# Patient Record
Sex: Male | Born: 1985 | Race: Black or African American | Hispanic: No | Marital: Single | Smoking: Current every day smoker
Health system: Southern US, Community
[De-identification: ages and names within clinical notes are randomized; demographics above are authoritative.]

## PROBLEM LIST (undated history)

## (undated) DIAGNOSIS — J45909 Unspecified asthma, uncomplicated: Secondary | ICD-10-CM

## (undated) DIAGNOSIS — T148XXA Other injury of unspecified body region, initial encounter: Secondary | ICD-10-CM

---
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
## 2008-11-29 ENCOUNTER — Emergency Department (HOSPITAL_COMMUNITY): Admission: EM | Admit: 2008-11-29 | Discharge: 2008-11-29 | Payer: Self-pay | Admitting: Emergency Medicine

## 2009-01-22 ENCOUNTER — Emergency Department (HOSPITAL_COMMUNITY): Admission: EM | Admit: 2009-01-22 | Discharge: 2009-01-22 | Payer: Self-pay | Admitting: Emergency Medicine

## 2009-05-23 ENCOUNTER — Emergency Department (HOSPITAL_COMMUNITY): Admission: EM | Admit: 2009-05-23 | Discharge: 2009-05-23 | Payer: Self-pay | Admitting: Emergency Medicine

## 2015-08-12 ENCOUNTER — Emergency Department (HOSPITAL_COMMUNITY): Payer: Self-pay

## 2015-08-12 ENCOUNTER — Observation Stay (HOSPITAL_COMMUNITY)
Admission: EM | Admit: 2015-08-12 | Discharge: 2015-08-13 | Disposition: A | Discharge status: Home / Self Care | Payer: Self-pay | Attending: General Surgery | Admitting: General Surgery

## 2015-08-12 ENCOUNTER — Encounter (HOSPITAL_COMMUNITY): Payer: Self-pay | Admitting: Emergency Medicine

## 2015-08-12 DIAGNOSIS — S21212A Laceration without foreign body of left back wall of thorax without penetration into thoracic cavity, initial encounter: Secondary | ICD-10-CM | POA: Insufficient documentation

## 2015-08-12 DIAGNOSIS — S41111A Laceration without foreign body of right upper arm, initial encounter: Principal | ICD-10-CM | POA: Insufficient documentation

## 2015-08-12 DIAGNOSIS — T148XXA Other injury of unspecified body region, initial encounter: Secondary | ICD-10-CM

## 2015-08-12 DIAGNOSIS — W458XXA Other foreign body or object entering through skin, initial encounter: Secondary | ICD-10-CM | POA: Insufficient documentation

## 2015-08-12 DIAGNOSIS — S51012A Laceration without foreign body of left elbow, initial encounter: Secondary | ICD-10-CM | POA: Insufficient documentation

## 2015-08-12 DIAGNOSIS — S42302A Unspecified fracture of shaft of humerus, left arm, initial encounter for closed fracture: Secondary | ICD-10-CM | POA: Insufficient documentation

## 2015-08-12 DIAGNOSIS — F17200 Nicotine dependence, unspecified, uncomplicated: Secondary | ICD-10-CM | POA: Insufficient documentation

## 2015-08-12 DIAGNOSIS — S41112A Laceration without foreign body of left upper arm, initial encounter: Secondary | ICD-10-CM | POA: Insufficient documentation

## 2015-08-12 LAB — COMPREHENSIVE METABOLIC PANEL
ALK PHOS: 85 U/L (ref 38–126)
ALT: 19 U/L (ref 17–63)
ANION GAP: 16 — AB (ref 5–15)
AST: 29 U/L (ref 15–41)
Albumin: 3.6 g/dL (ref 3.5–5.0)
BUN: 11 mg/dL (ref 6–20)
CALCIUM: 8.4 mg/dL — AB (ref 8.9–10.3)
CO2: 18 mmol/L — AB (ref 22–32)
Chloride: 103 mmol/L (ref 101–111)
Creatinine, Ser: 1.42 mg/dL — ABNORMAL HIGH (ref 0.61–1.24)
GFR calc non Af Amer: >60 mL/min (ref >60)
Glucose, Bld: 158 mg/dL — ABNORMAL HIGH (ref 65–99)
Potassium: 3.9 mmol/L (ref 3.5–5.1)
SODIUM: 137 mmol/L (ref 135–145)
TOTAL PROTEIN: 5.6 g/dL — AB (ref 6.5–8.1)
Total Bilirubin: 0.8 mg/dL (ref 0.3–1.2)

## 2015-08-12 LAB — PREPARE FRESH FROZEN PLASMA
UNIT DIVISION: 0
Unit division: 0

## 2015-08-12 LAB — PROTIME-INR
INR: 1.15 (ref 0.00–1.49)
PROTHROMBIN TIME: 14.9 s (ref 11.6–15.2)

## 2015-08-12 LAB — CBC
HCT: 37.6 % — ABNORMAL LOW (ref 39.0–52.0)
HEMOGLOBIN: 12.7 g/dL — AB (ref 13.0–17.0)
MCH: 31.4 pg (ref 26.0–34.0)
MCHC: 33.8 g/dL (ref 30.0–36.0)
MCV: 92.8 fL (ref 78.0–100.0)
Platelets: 249 10*3/uL (ref 150–400)
RBC: 4.05 MIL/uL — AB (ref 4.22–5.81)
RDW: 13.8 % (ref 11.5–15.5)
WBC: 9.6 10*3/uL (ref 4.0–10.5)

## 2015-08-12 LAB — ABO/RH: ABO/RH(D): O POS

## 2015-08-12 LAB — CDS SEROLOGY

## 2015-08-12 LAB — ETHANOL

## 2015-08-12 MED ORDER — IOHEXOL 300 MG/ML  SOLN
100.0000 mL | Freq: Once | INTRAMUSCULAR | Status: AC | PRN
  Administered 2015-08-12: 100 mL via INTRAVENOUS

## 2015-08-12 MED ORDER — TETANUS-DIPHTH-ACELL PERTUSSIS 5-2.5-18.5 LF-MCG/0.5 IM SUSP
INTRAMUSCULAR | Status: AC
  Filled 2015-08-12: qty 0.5

## 2015-08-12 MED ORDER — FENTANYL CITRATE (PF) 100 MCG/2ML IJ SOLN
INTRAMUSCULAR | Status: AC
  Filled 2015-08-12: qty 2

## 2015-08-12 MED ORDER — LIDOCAINE-EPINEPHRINE (PF) 2 %-1:200000 IJ SOLN
INTRAMUSCULAR | Status: AC
  Filled 2015-08-12: qty 20

## 2015-08-12 MED ORDER — FENTANYL CITRATE (PF) 100 MCG/2ML IJ SOLN
INTRAMUSCULAR | Status: AC | PRN
  Administered 2015-08-12 (×2): 50 ug via INTRAVENOUS

## 2015-08-12 MED ORDER — TETANUS-DIPHTH-ACELL PERTUSSIS 5-2.5-18.5 LF-MCG/0.5 IM SUSP
0.5000 mL | Freq: Once | INTRAMUSCULAR | Status: AC
  Administered 2015-08-12: 0.5 mL via INTRAMUSCULAR
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
## 2015-08-12 NOTE — ED Notes (Signed)
Patient to CT with RN

## 2015-08-13 ENCOUNTER — Encounter: Payer: Self-pay | Admitting: Orthopedic Surgery

## 2015-08-13 DIAGNOSIS — S41111A Laceration without foreign body of right upper arm, initial encounter: Secondary | ICD-10-CM | POA: Diagnosis present

## 2015-08-13 DIAGNOSIS — S21212A Laceration without foreign body of left back wall of thorax without penetration into thoracic cavity, initial encounter: Secondary | ICD-10-CM | POA: Diagnosis present

## 2015-08-13 DIAGNOSIS — S41112A Laceration without foreign body of left upper arm, initial encounter: Secondary | ICD-10-CM | POA: Diagnosis present

## 2015-08-13 DIAGNOSIS — S42302A Unspecified fracture of shaft of humerus, left arm, initial encounter for closed fracture: Secondary | ICD-10-CM | POA: Diagnosis present

## 2015-08-13 DIAGNOSIS — T148XXA Other injury of unspecified body region, initial encounter: Secondary | ICD-10-CM

## 2015-08-13 DIAGNOSIS — S51012A Laceration without foreign body of left elbow, initial encounter: Secondary | ICD-10-CM | POA: Diagnosis present

## 2015-08-13 HISTORY — DX: Other injury of unspecified body region, initial encounter: T14.8XXA

## 2015-08-13 LAB — CBC
HEMATOCRIT: 29.9 % — AB (ref 39.0–52.0)
HEMATOCRIT: 30.5 % — AB (ref 39.0–52.0)
HEMOGLOBIN: 10.1 g/dL — AB (ref 13.0–17.0)
HEMOGLOBIN: 10.7 g/dL — AB (ref 13.0–17.0)
MCH: 30.3 pg (ref 26.0–34.0)
MCH: 31.4 pg (ref 26.0–34.0)
MCHC: 33.4 g/dL (ref 30.0–36.0)
MCHC: 35.1 g/dL (ref 30.0–36.0)
MCV: 90.6 fL (ref 78.0–100.0)
MCV: 89.4 fL (ref 78.0–100.0)
Platelets: 223 10*3/uL (ref 150–400)
Platelets: 256 10*3/uL (ref 150–400)
RBC: 3.3 MIL/uL — AB (ref 4.22–5.81)
RBC: 3.41 MIL/uL — ABNORMAL LOW (ref 4.22–5.81)
RDW: 13.7 % (ref 11.5–15.5)
RDW: 13.6 % (ref 11.5–15.5)
WBC: 13.5 10*3/uL — AB (ref 4.0–10.5)
WBC: 13.4 10*3/uL — ABNORMAL HIGH (ref 4.0–10.5)

## 2015-08-13 LAB — TYPE AND SCREEN
ABO/RH(D): O POS
Antibody Screen: NEGATIVE
UNIT DIVISION: 0
Unit division: 0

## 2015-08-13 LAB — BLOOD PRODUCT ORDER (VERBAL) VERIFICATION

## 2015-08-13 MED ORDER — ACETAMINOPHEN 325 MG PO TABS: 650.0000 mg | ORAL_TABLET | ORAL | Status: DC | PRN

## 2015-08-13 MED ORDER — KCL IN DEXTROSE-NACL 20-5-0.45 MEQ/L-%-% IV SOLN
INTRAVENOUS | Status: DC
  Administered 2015-08-13: 02:00:00 via INTRAVENOUS
  Filled 2015-08-13: qty 1000

## 2015-08-13 MED ORDER — ENOXAPARIN SODIUM 40 MG/0.4ML ~~LOC~~ SOLN
40.0000 mg | SUBCUTANEOUS | Status: DC
  Administered 2015-08-13: 40 mg via SUBCUTANEOUS
  Filled 2015-08-13: qty 0.4

## 2015-08-13 MED ORDER — MORPHINE SULFATE (PF) 4 MG/ML IV SOLN
4.0000 mg | INTRAVENOUS | Status: DC | PRN
  Administered 2015-08-13 (×2): 4 mg via INTRAVENOUS
  Filled 2015-08-13 (×2): qty 1

## 2015-08-13 MED ORDER — OXYCODONE HCL 5 MG PO TABS
10.0000 mg | ORAL_TABLET | ORAL | Status: DC | PRN
  Administered 2015-08-13 (×2): 10 mg via ORAL
  Filled 2015-08-13 (×2): qty 2

## 2015-08-13 MED ORDER — PANTOPRAZOLE SODIUM 40 MG PO TBEC
40.0000 mg | DELAYED_RELEASE_TABLET | Freq: Every day | ORAL | Status: DC
  Administered 2015-08-13: 40 mg via ORAL
  Filled 2015-08-13: qty 1

## 2015-08-13 MED ORDER — PANTOPRAZOLE SODIUM 40 MG IV SOLR: 40.0000 mg | Freq: Every day | INTRAVENOUS | Status: DC

## 2015-08-13 MED ORDER — OXYCODONE-ACETAMINOPHEN 5-325 MG PO TABS
1.0000 | ORAL_TABLET | ORAL | Status: DC | PRN
Start: 2015-08-13 — End: 2015-10-05

## 2015-08-13 MED ORDER — OXYCODONE HCL 5 MG PO TABS
5.0000 mg | ORAL_TABLET | ORAL | Status: DC | PRN
  Administered 2015-08-13: 5 mg via ORAL
  Filled 2015-08-13: qty 1

## 2015-08-13 MED ORDER — ONDANSETRON HCL 4 MG PO TABS: 4.0000 mg | ORAL_TABLET | Freq: Four times a day (QID) | ORAL | Status: DC | PRN

## 2015-08-13 MED ORDER — ONDANSETRON HCL 4 MG/2ML IJ SOLN: 4.0000 mg | Freq: Four times a day (QID) | INTRAMUSCULAR | Status: DC | PRN
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
## 2015-08-13 NOTE — Progress Notes (Signed)
D/C orders received, pt for D/C home today.  IV D/C.  Rx and D/C instructions given with verbalized understanding.  Family at bedside to assist with D/C.  Staff brought pt downstairs via wheelchair.
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
## 2015-08-13 NOTE — Progress Notes (Signed)
Pt transferred to unit from ED via NT x 1. Pt alert and oriented upon arrival. Only complaint is of left arm/elbow pain. No signs or symptoms of acute distress. Pt oriented to unit as well as unit procedures. Pt now resting in bed at lowest position, bed alarm on, call light in reach. Will continue to monitor. Delfino Lovettichie Nguyet Mercer, RN, BSN 08/13/2015 1:51 AM
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
## 2015-08-13 NOTE — Discharge Instructions (Signed)
Wash wounds daily in shower with soap and water. °Do not soak. °Apply antibiotic ointment (e.g. Neosporin) twice daily and as needed to keep moist. °Cover with dry dressing. ° °No driving while taking oxycodone. °

## 2015-08-13 NOTE — ED Notes (Signed)
Attempted report 
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
## 2015-08-17 ENCOUNTER — Emergency Department (HOSPITAL_COMMUNITY)
Admission: EM | Admit: 2015-08-17 | Discharge: 2015-08-17 | Disposition: A | Discharge status: Home / Self Care | Payer: Self-pay | Attending: Emergency Medicine | Admitting: Emergency Medicine

## 2015-08-17 ENCOUNTER — Encounter (HOSPITAL_COMMUNITY): Payer: Self-pay | Admitting: Oncology

## 2015-08-17 DIAGNOSIS — R5381 Other malaise: Secondary | ICD-10-CM | POA: Insufficient documentation

## 2015-08-17 DIAGNOSIS — R51 Headache: Secondary | ICD-10-CM | POA: Insufficient documentation

## 2015-08-17 DIAGNOSIS — Z880 Allergy status to penicillin: Secondary | ICD-10-CM | POA: Insufficient documentation

## 2015-08-17 DIAGNOSIS — J45909 Unspecified asthma, uncomplicated: Secondary | ICD-10-CM | POA: Insufficient documentation

## 2015-08-17 DIAGNOSIS — Z720 Tobacco use: Secondary | ICD-10-CM | POA: Insufficient documentation

## 2015-08-17 DIAGNOSIS — R509 Fever, unspecified: Secondary | ICD-10-CM | POA: Insufficient documentation

## 2015-08-17 DIAGNOSIS — L03114 Cellulitis of left upper limb: Secondary | ICD-10-CM | POA: Insufficient documentation

## 2015-08-17 HISTORY — DX: Other injury of unspecified body region, initial encounter: T14.8XXA

## 2015-08-17 HISTORY — DX: Unspecified asthma, uncomplicated: J45.909

## 2015-08-17 MED ORDER — BACITRACIN ZINC 500 UNIT/GM EX OINT
TOPICAL_OINTMENT | CUTANEOUS | Status: AC
  Administered 2015-08-17: 21:00:00
  Filled 2015-08-17: qty 0.9

## 2015-08-17 MED ORDER — DOXYCYCLINE HYCLATE 100 MG PO CAPS: 100.0000 mg | ORAL_CAPSULE | Freq: Two times a day (BID) | ORAL | Status: DC

## 2015-08-17 MED ORDER — LIDOCAINE-EPINEPHRINE 2 %-1:100000 IJ SOLN
INTRAMUSCULAR | Status: AC
  Administered 2015-08-17: 1 mL
  Filled 2015-08-17: qty 1
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
## 2015-08-17 NOTE — Discharge Instructions (Signed)
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
## 2015-08-27 ENCOUNTER — Emergency Department (HOSPITAL_COMMUNITY)
Admission: EM | Admit: 2015-08-27 | Discharge: 2015-08-27 | Disposition: A | Discharge status: Home / Self Care | Payer: Self-pay | Attending: Emergency Medicine | Admitting: Emergency Medicine

## 2015-08-27 ENCOUNTER — Encounter (HOSPITAL_COMMUNITY): Payer: Self-pay | Admitting: Emergency Medicine

## 2015-08-27 DIAGNOSIS — Z792 Long term (current) use of antibiotics: Secondary | ICD-10-CM | POA: Insufficient documentation

## 2015-08-27 DIAGNOSIS — J45909 Unspecified asthma, uncomplicated: Secondary | ICD-10-CM | POA: Insufficient documentation

## 2015-08-27 DIAGNOSIS — F17200 Nicotine dependence, unspecified, uncomplicated: Secondary | ICD-10-CM | POA: Insufficient documentation

## 2015-08-27 DIAGNOSIS — Z880 Allergy status to penicillin: Secondary | ICD-10-CM | POA: Insufficient documentation

## 2015-08-27 DIAGNOSIS — Z4802 Encounter for removal of sutures: Secondary | ICD-10-CM | POA: Insufficient documentation

## 2015-08-27 NOTE — Discharge Instructions (Signed)
Return here as needed.  Follow-up with the orthopedic doctor for your elbow
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
## 2015-10-04 DIAGNOSIS — R634 Abnormal weight loss: Secondary | ICD-10-CM | POA: Insufficient documentation

## 2015-10-04 DIAGNOSIS — F17200 Nicotine dependence, unspecified, uncomplicated: Secondary | ICD-10-CM | POA: Insufficient documentation

## 2015-10-04 DIAGNOSIS — Z87828 Personal history of other (healed) physical injury and trauma: Secondary | ICD-10-CM | POA: Insufficient documentation

## 2015-10-04 DIAGNOSIS — K5900 Constipation, unspecified: Secondary | ICD-10-CM | POA: Insufficient documentation

## 2015-10-04 DIAGNOSIS — R109 Unspecified abdominal pain: Secondary | ICD-10-CM | POA: Insufficient documentation

## 2015-10-04 DIAGNOSIS — J45909 Unspecified asthma, uncomplicated: Secondary | ICD-10-CM | POA: Insufficient documentation

## 2015-10-04 DIAGNOSIS — R630 Anorexia: Secondary | ICD-10-CM | POA: Insufficient documentation

## 2015-10-04 DIAGNOSIS — Z880 Allergy status to penicillin: Secondary | ICD-10-CM | POA: Insufficient documentation

## 2015-10-04 DIAGNOSIS — R110 Nausea: Secondary | ICD-10-CM | POA: Insufficient documentation

## 2015-10-04 DIAGNOSIS — R309 Painful micturition, unspecified: Secondary | ICD-10-CM | POA: Insufficient documentation

## 2015-10-05 ENCOUNTER — Emergency Department (HOSPITAL_COMMUNITY)
Admission: EM | Admit: 2015-10-05 | Discharge: 2015-10-05 | Disposition: A | Discharge status: Home / Self Care | Payer: Self-pay | Attending: Emergency Medicine | Admitting: Emergency Medicine

## 2015-10-05 ENCOUNTER — Emergency Department (HOSPITAL_COMMUNITY): Payer: Self-pay

## 2015-10-05 ENCOUNTER — Encounter (HOSPITAL_COMMUNITY): Payer: Self-pay | Admitting: Oncology

## 2015-10-05 DIAGNOSIS — R109 Unspecified abdominal pain: Secondary | ICD-10-CM

## 2015-10-05 DIAGNOSIS — R634 Abnormal weight loss: Secondary | ICD-10-CM

## 2015-10-05 LAB — DIFFERENTIAL
BASOS PCT: 0 %
Basophils Absolute: 0 10*3/uL (ref 0.0–0.1)
EOS ABS: 0.2 10*3/uL (ref 0.0–0.7)
EOS PCT: 3 %
Lymphocytes Relative: 20 %
Lymphs Abs: 1.6 10*3/uL (ref 0.7–4.0)
MONO ABS: 0.5 10*3/uL (ref 0.1–1.0)
Monocytes Relative: 6 %
NEUTROS ABS: 5.7 10*3/uL (ref 1.7–7.7)
Neutrophils Relative %: 71 %

## 2015-10-05 LAB — LIPASE, BLOOD: Lipase: 21 U/L (ref 11–51)

## 2015-10-05 LAB — CBC
HEMATOCRIT: 43.2 % (ref 39.0–52.0)
Hemoglobin: 14.5 g/dL (ref 13.0–17.0)
MCH: 31.6 pg (ref 26.0–34.0)
MCHC: 33.6 g/dL (ref 30.0–36.0)
MCV: 94.1 fL (ref 78.0–100.0)
Platelets: 263 10*3/uL (ref 150–400)
RBC: 4.59 MIL/uL (ref 4.22–5.81)
RDW: 13.8 % (ref 11.5–15.5)
WBC: 8.2 10*3/uL (ref 4.0–10.5)

## 2015-10-05 LAB — COMPREHENSIVE METABOLIC PANEL
ALBUMIN: 4.9 g/dL (ref 3.5–5.0)
ALK PHOS: 99 U/L (ref 38–126)
ALT: 20 U/L (ref 17–63)
AST: 21 U/L (ref 15–41)
Anion gap: 9 (ref 5–15)
BILIRUBIN TOTAL: 0.4 mg/dL (ref 0.3–1.2)
BUN: 13 mg/dL (ref 6–20)
CO2: 28 mmol/L (ref 22–32)
Calcium: 9.7 mg/dL (ref 8.9–10.3)
Chloride: 104 mmol/L (ref 101–111)
Creatinine, Ser: 1.19 mg/dL (ref 0.61–1.24)
GFR calc Af Amer: >60 mL/min (ref >60)
GFR calc non Af Amer: >60 mL/min (ref >60)
GLUCOSE: 133 mg/dL — AB (ref 65–99)
POTASSIUM: 3.7 mmol/L (ref 3.5–5.1)
Sodium: 141 mmol/L (ref 135–145)
TOTAL PROTEIN: 8.1 g/dL (ref 6.5–8.1)

## 2015-10-05 LAB — URINALYSIS, ROUTINE W REFLEX MICROSCOPIC
BILIRUBIN URINE: NEGATIVE
GLUCOSE, UA: NEGATIVE mg/dL
HGB URINE DIPSTICK: NEGATIVE
KETONES UR: NEGATIVE mg/dL
Leukocytes, UA: NEGATIVE
NITRITE: NEGATIVE
PH: 6 (ref 5.0–8.0)
Protein, ur: NEGATIVE mg/dL
Specific Gravity, Urine: 1.043 — ABNORMAL HIGH (ref 1.005–1.030)

## 2015-10-05 MED ORDER — IOHEXOL 300 MG/ML  SOLN
100.0000 mL | Freq: Once | INTRAMUSCULAR | Status: AC | PRN
  Administered 2015-10-05: 100 mL via INTRAVENOUS

## 2015-10-05 MED ORDER — IOHEXOL 300 MG/ML  SOLN
50.0000 mL | Freq: Once | INTRAMUSCULAR | Status: AC | PRN
  Administered 2015-10-05: 50 mL via ORAL

## 2015-10-05 MED ORDER — DICYCLOMINE HCL 10 MG PO CAPS
20.0000 mg | ORAL_CAPSULE | Freq: Once | ORAL | Status: AC
  Administered 2015-10-05: 20 mg via ORAL
  Filled 2015-10-05: qty 2

## 2015-10-05 MED ORDER — DICYCLOMINE HCL 20 MG PO TABS: 20.0000 mg | ORAL_TABLET | Freq: Four times a day (QID) | ORAL | Status: AC | PRN
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
## 2015-10-05 NOTE — Discharge Instructions (Signed)
Your evaluation today did not find a cause for your abdominal pain or weight loss. Please make a follow-up appointment with the gastroenterologist for further evaluation.  Abdominal Pain, Adult Many things can cause abdominal pain. Usually, abdominal pain is not caused by a disease and will improve without treatment. It can often be observed and treated at home. Your health care provider will do a physical exam and possibly order blood tests and X-rays to help determine the seriousness of your pain. However, in many cases, more time must pass before a clear cause of the pain can be found. Before that point, your health care provider may not know if you need more testing or further treatment. HOME CARE INSTRUCTIONS Monitor your abdominal pain for any changes. The following actions may help to alleviate any discomfort you are experiencing:  Only take over-the-counter or prescription medicines as directed by your health care provider.  Do not take laxatives unless directed to do so by your health care provider.  Try a clear liquid diet (broth, tea, or water) as directed by your health care provider. Slowly move to a bland diet as tolerated. SEEK MEDICAL CARE IF:  You have unexplained abdominal pain.  You have abdominal pain associated with nausea or diarrhea.  You have pain when you urinate or have a bowel movement.  You experience abdominal pain that wakes you in the night.  You have abdominal pain that is worsened or improved by eating food.  You have abdominal pain that is worsened with eating fatty foods.  You have a fever. SEEK IMMEDIATE MEDICAL CARE IF:  Your pain does not go away within 2 hours.  You keep throwing up (vomiting).  Your pain is felt only in portions of the abdomen, such as the right side or the left lower portion of the abdomen.  You pass bloody or black tarry stools. MAKE SURE YOU:  Understand these instructions.  Will watch your condition.  Will get help  right away if you are not doing well or get worse.   This information is not intended to replace advice given to you by your health care provider. Make sure you discuss any questions you have with your health care provider.   Document Released: 07/08/2005 Document Revised: 06/19/2015 Document Reviewed: 06/07/2013 Elsevier Interactive Patient Education 2016 Elsevier Inc.  Dicyclomine tablets or capsules What is this medicine? DICYCLOMINE (dye SYE kloe meen) is used to treat bowel problems including irritable bowel syndrome. This medicine may be used for other purposes; ask your health care provider or pharmacist if you have questions. What should I tell my health care provider before I take this medicine? They need to know if you have any of these conditions: -difficulty passing urine -esophagus problems or heartburn -glaucoma -heart disease, or previous heart attack -myasthenia gravis -prostate trouble -stomach infection, or obstruction -ulcerative colitis -an unusual or allergic reaction to dicyclomine, other medicines, foods, dyes, or preservatives -pregnant or trying to get pregnant -breast-feeding How should I use this medicine? Take this medicine by mouth with a glass of water. Follow the directions on the prescription label. It is best to take this medicine on an empty stomach, 30 minutes to 1 hour before meals. Take your medicine at regular intervals. Do not take your medicine more often than directed. Talk to your pediatrician regarding the use of this medicine in children. Special care may be needed. While this drug may be prescribed for children as young as 89 months of age for selected conditions,  precautions do apply. Patients over 30 years old may have a stronger reaction and need a smaller dose. Overdosage: If you think you have taken too much of this medicine contact a poison control center or emergency room at once. NOTE: This medicine is only for you. Do not share this  medicine with others. What if I miss a dose? If you miss a dose, take it as soon as you can. If it is almost time for your next dose, take only that dose. Do not take double or extra doses. What may interact with this medicine? -amantadine -antacids -benztropine -digoxin -disopyramide -medicines for allergies, colds and breathing difficulties -medicines for alzheimer's disease -medicines for anxiety or sleeping problems -medicines for depression or psychotic disturbances -medicines for diarrhea -medicines for pain -metoclopramide -tegaserod This list may not describe all possible interactions. Give your health care provider a list of all the medicines, herbs, non-prescription drugs, or dietary supplements you use. Also tell them if you smoke, drink alcohol, or use illegal drugs. Some items may interact with your medicine. What should I watch for while using this medicine? You may get drowsy, dizzy, or have blurred vision. Do not drive, use machinery, or do anything that needs mental alertness until you know how this medicine affects you. To reduce the risk of dizzy or fainting spells, do not sit or stand up quickly, especially if you are an older patient. Alcohol can make you more drowsy, avoid alcoholic drinks. Stay out of bright light and wear sunglasses if this medicine makes your eyes more sensitive to light. Avoid extreme heat (hot tubs, saunas). This medicine can cause you to sweat less than normal. Your body temperature could increase to dangerous levels, which may lead to heat stroke. Antacids can stop this medicine from working. If you get an upset stomach and want to take an antacid, make sure there is an interval of at least 1 to 2 hours before or after you take this medicine. Your mouth may get dry. Chewing sugarless gum or sucking hard candy, and drinking plenty of water may help. Contact your doctor if the problem does not go away or is severe. What side effects may I notice  from receiving this medicine? Side effects that you should report to your doctor or health care professional as soon as possible: -agitation, nervousness, confusion -difficulty swallowing -dizziness, drowsiness -fast or slow heartbeat -hallucinations -pain or difficulty passing urine Side effects that usually do not require medical attention (report to your doctor or health care professional if they continue or are bothersome): -constipation -headache -nausea or vomiting -sexual difficulty This list may not describe all possible side effects. Call your doctor for medical advice about side effects. You may report side effects to FDA at 1-800-FDA-1088. Where should I keep my medicine? Keep out of the reach of children. Store at room temperature below 30 degrees C (86 degrees F). Protect from light. Throw away any unused medicine after the expiration date. NOTE: This sheet is a summary. It may not cover all possible information. If you have questions about this medicine, talk to your doctor, pharmacist, or health care provider.    2016, Elsevier/Gold Standard. (2008-01-17 17:12:34)

## 2015-10-05 NOTE — ED Notes (Signed)
Pt c/o abdominal pain since august.  Pain is generalized and aching in nature.  Pt rates pain 9/10.

## 2015-10-05 NOTE — ED Notes (Signed)
Pt reports intermittent 9/10 lower/mid abdominal pain x 4 months, not currently in pain at this time.  He states that he has had occasional bouts of diarrhea but none today.  In addition to his pain, he has also noticed a dramatic decrease in his appetite since August and feels generally ill.  No acute distress noted on assessment. Abdomen is soft and nontender to palpation, +BS in all 4 quadrants.
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
## 2017-03-16 IMAGING — CT CT ABD-PELV W/ CM
2 of 4 series · 16 of 46 positions shown, 18 images · IV contrast (OMNIPAQUE 300)
Comparison: None.

CLINICAL DATA: Intermittent chronic lower and mid abdominal pain.
Diarrhea. Decrease in appetite. Initial encounter.

EXAM:
CT ABDOMEN AND PELVIS WITH CONTRAST
TECHNIQUE: Multidetector CT imaging of the abdomen and pelvis was performed
using the standard protocol following bolus administration of
intravenous contrast.
CONTRAST:  100mL OMNIPAQUE IOHEXOL 300 MG/ML  SOLN

[Series 2: abd/pel with · axial · 0.66mm/px · z∈[-451,-86]mm · 13 of 81 slices shown, 15 images]
[im 4/81  soft-tissue]
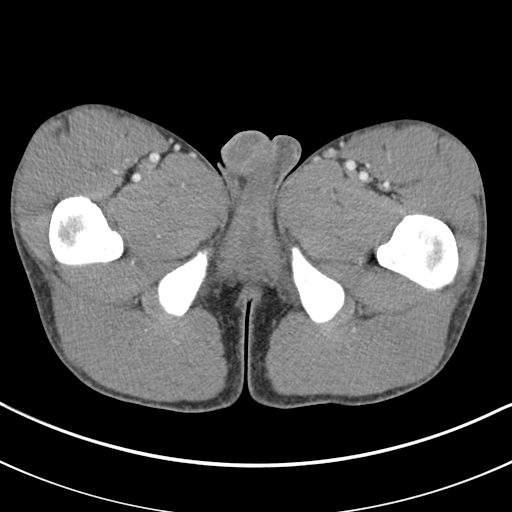
[im 4/81  bone]
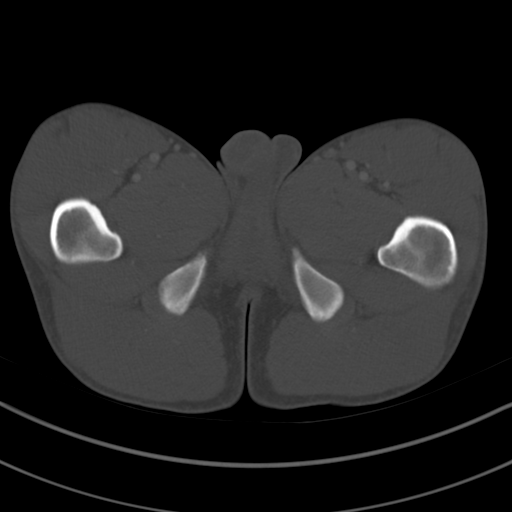
[im 11/81  soft-tissue]
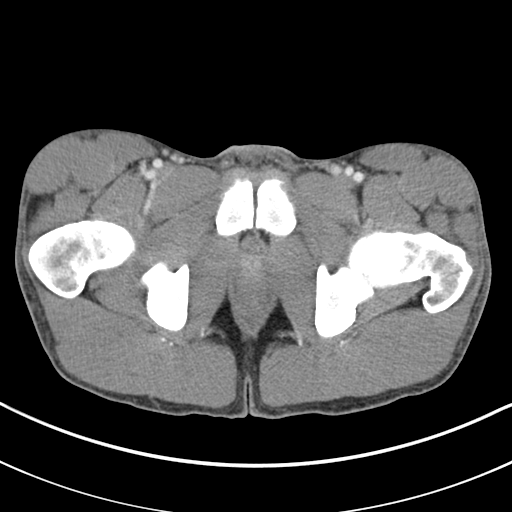
[im 18/81  soft-tissue]
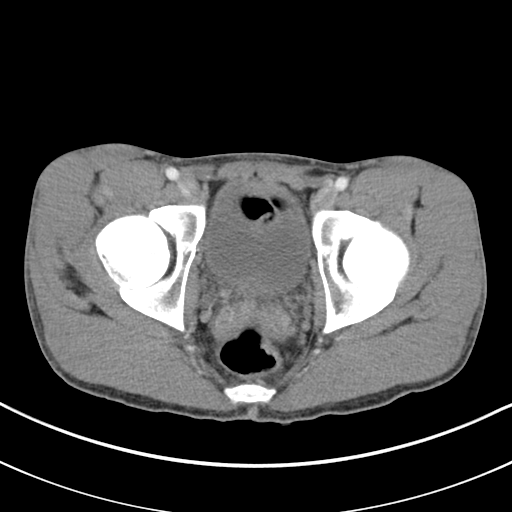
[im 21/81  soft-tissue]
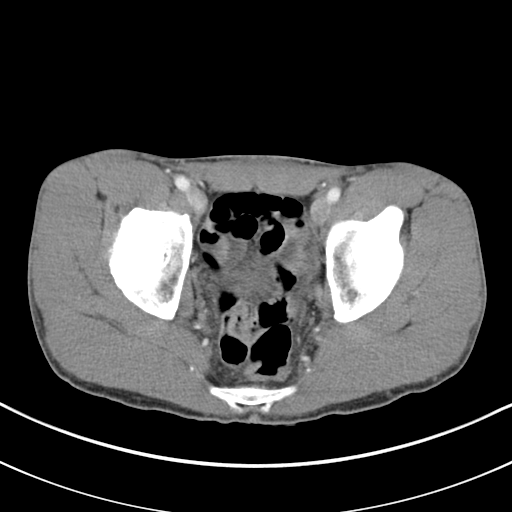
[im 28/81  soft-tissue]
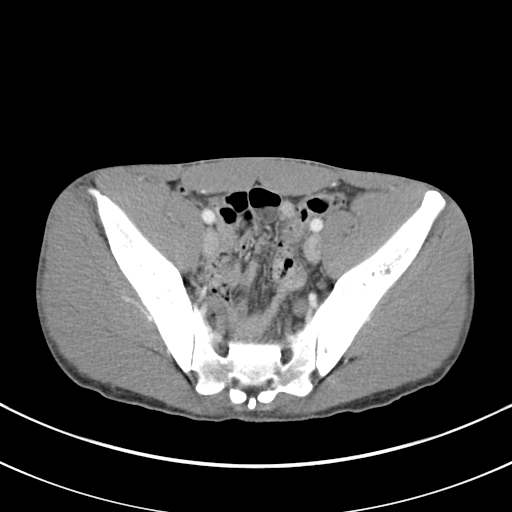
[im 35/81  soft-tissue]
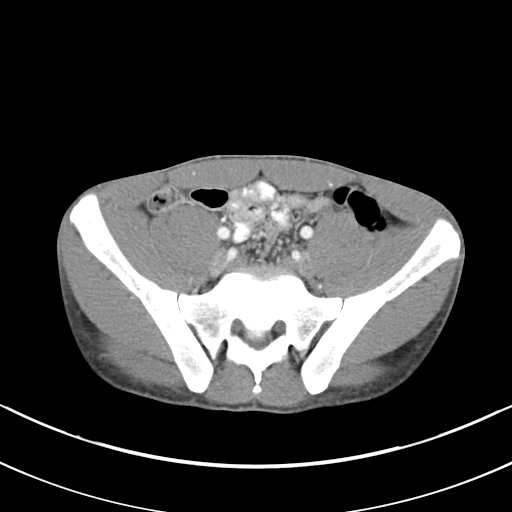
[im 42/81  soft-tissue]
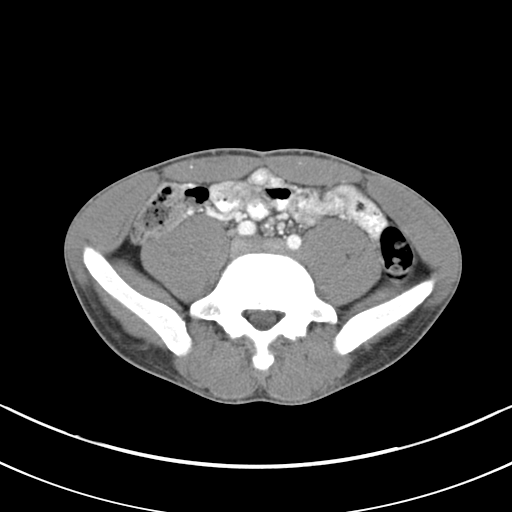
[im 46/81  soft-tissue]
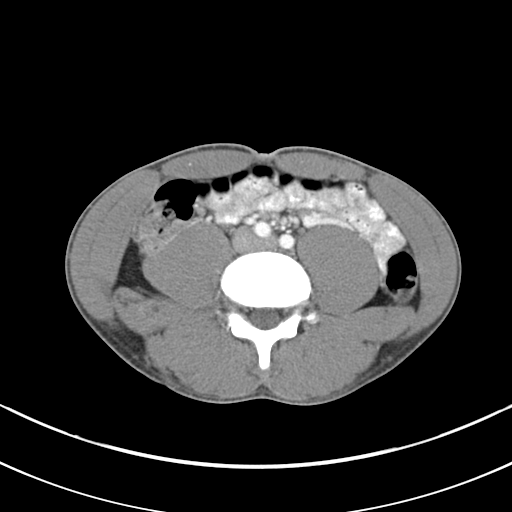
[im 53/81  soft-tissue]
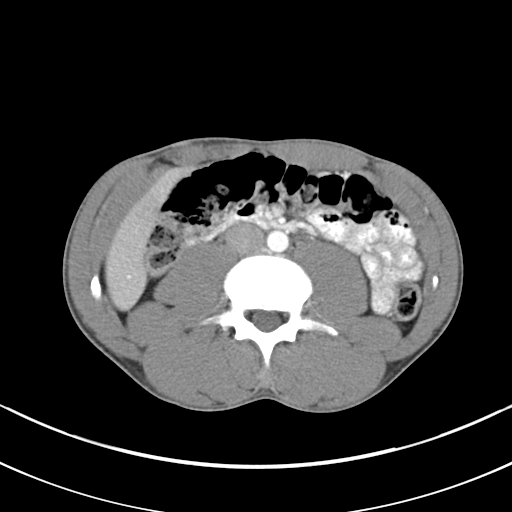
[im 53/81  bone]
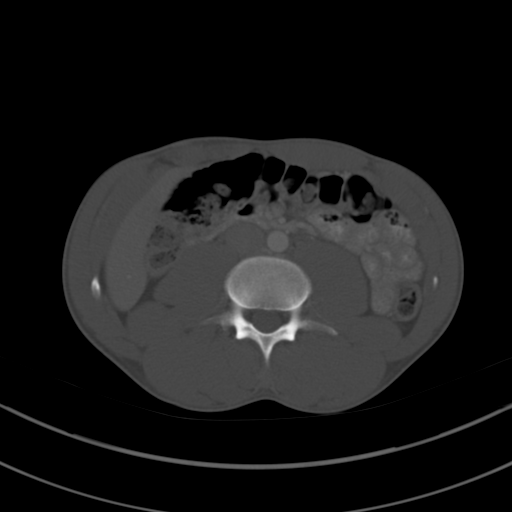
[im 60/81  soft-tissue]
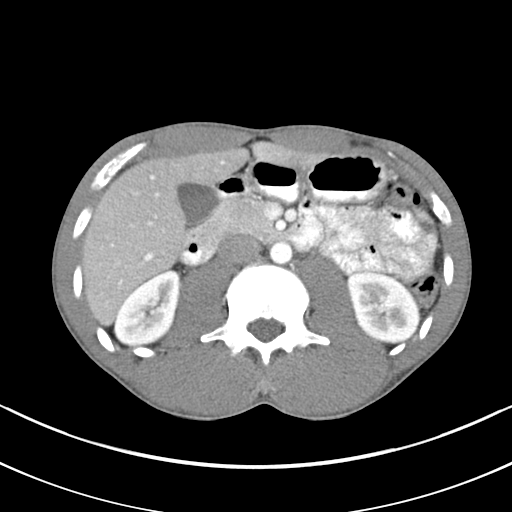
[im 63/81  soft-tissue]
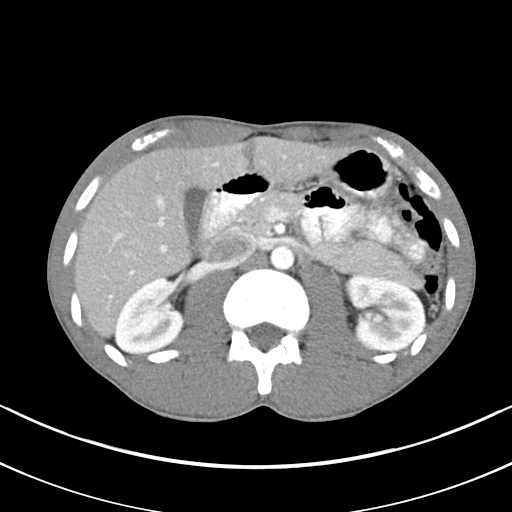
[im 70/81  soft-tissue]
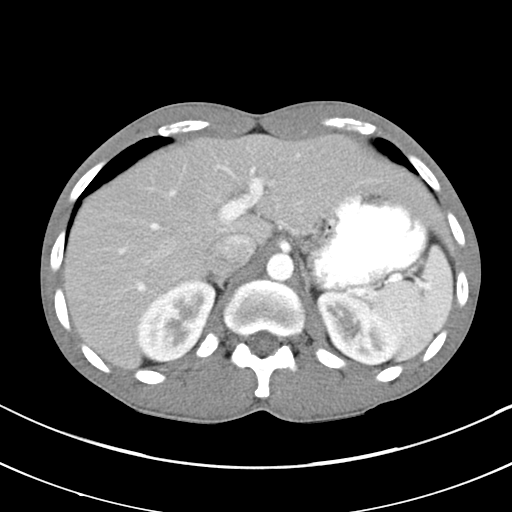
[im 77/81  soft-tissue]
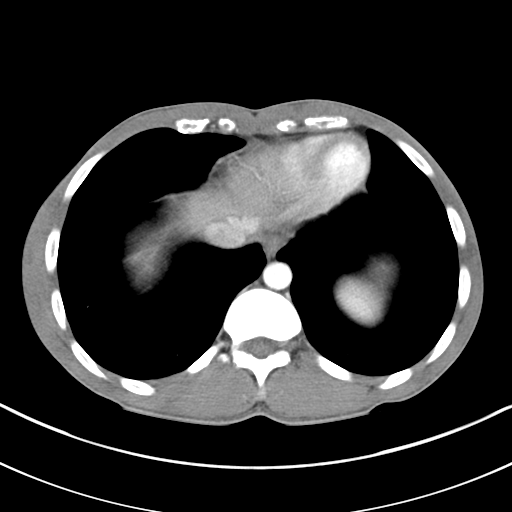

[Series 5: coronal a/|p · coronal · 0.64mm/px · 3 of 66 slices shown]
[im 22/66  soft-tissue]
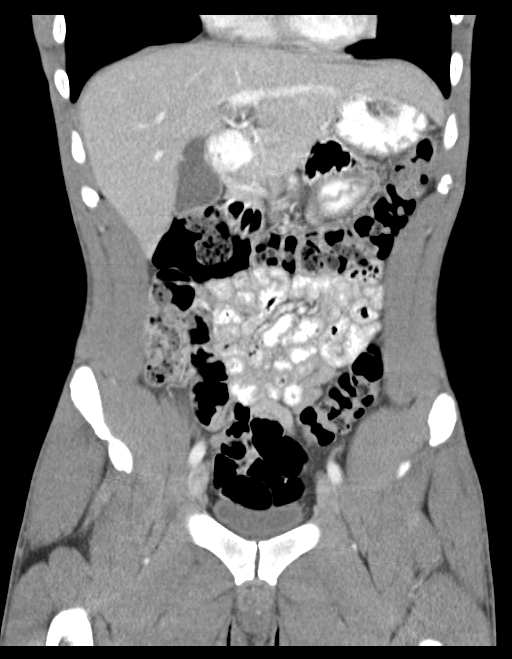
[im 29/66  soft-tissue]
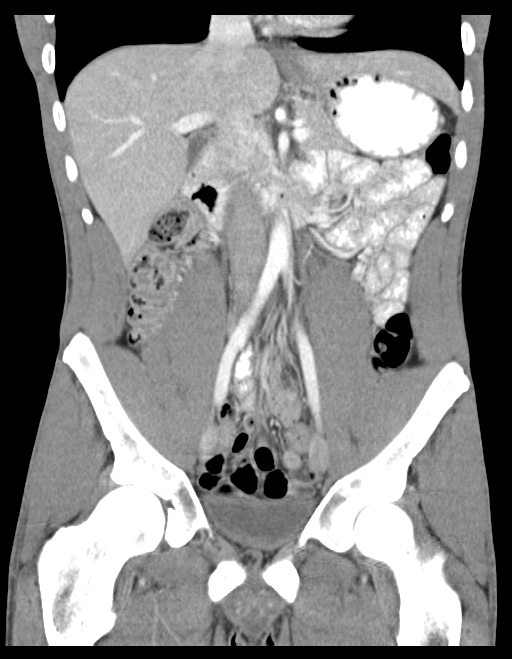
[im 37/66  soft-tissue]
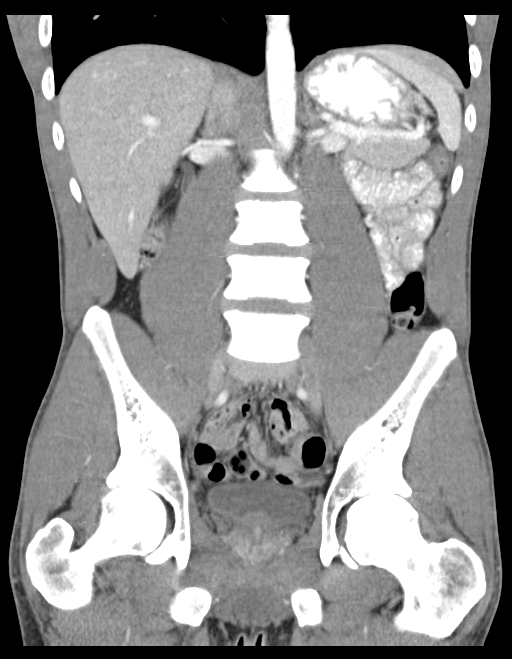

[16 of 46 positions shown; findings below may reference images not displayed]

FINDINGS: The visualized lung bases are clear.

The liver and spleen are unremarkable in appearance. The gallbladder
is within normal limits. The pancreas and adrenal glands are
unremarkable.

The kidneys are unremarkable in appearance. There is no evidence of
hydronephrosis. No renal or ureteral stones are seen. No perinephric
stranding is appreciated.

No free fluid is identified. The small bowel is unremarkable in
appearance. The stomach is within normal limits. No acute vascular
abnormalities are seen.

The appendix is normal in caliber, without evidence of appendicitis.
The colon is unremarkable in appearance.

The bladder is mildly distended and grossly unremarkable. The
prostate remains normal in size. No inguinal lymphadenopathy is
seen.

No acute osseous abnormalities are identified.
IMPRESSION: Unremarkable contrast-enhanced CT of the abdomen and pelvis.

## 2018-03-17 ENCOUNTER — Emergency Department (HOSPITAL_COMMUNITY)
Admission: EM | Admit: 2018-03-17 | Discharge: 2018-03-17 | Disposition: A | Discharge status: Home / Self Care | Payer: BLUE CROSS/BLUE SHIELD | Attending: Emergency Medicine | Admitting: Emergency Medicine

## 2018-03-17 ENCOUNTER — Encounter (HOSPITAL_COMMUNITY): Payer: Self-pay | Admitting: *Deleted

## 2018-03-17 ENCOUNTER — Other Ambulatory Visit: Payer: Self-pay

## 2018-03-17 DIAGNOSIS — K029 Dental caries, unspecified: Secondary | ICD-10-CM | POA: Insufficient documentation

## 2018-03-17 DIAGNOSIS — J45909 Unspecified asthma, uncomplicated: Secondary | ICD-10-CM | POA: Diagnosis not present

## 2018-03-17 DIAGNOSIS — F17210 Nicotine dependence, cigarettes, uncomplicated: Secondary | ICD-10-CM | POA: Insufficient documentation

## 2018-03-17 DIAGNOSIS — R590 Localized enlarged lymph nodes: Secondary | ICD-10-CM | POA: Diagnosis not present

## 2018-03-17 DIAGNOSIS — R070 Pain in throat: Secondary | ICD-10-CM | POA: Diagnosis present

## 2018-03-17 DIAGNOSIS — R591 Generalized enlarged lymph nodes: Secondary | ICD-10-CM

## 2018-03-17 LAB — GROUP A STREP BY PCR: GROUP A STREP BY PCR: NOT DETECTED

## 2018-03-17 MED ORDER — CLINDAMYCIN HCL 300 MG PO CAPS: 300.0000 mg | ORAL_CAPSULE | Freq: Three times a day (TID) | ORAL | 0 refills | Status: AC

## 2018-03-17 NOTE — ED Triage Notes (Signed)
Pt c/o left side throat pain x 1.5-2 weeks.  Pt presents without redness to throat or enlarged tonsils.
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
## 2018-03-17 NOTE — Discharge Instructions (Addendum)
Your symptoms may be related to a dental infection.  Take antibiotics as prescribed.  Follow-up with a dentist for your teeth and a primary care doctor.  Return to ED if swelling worsens, trouble swallowing, difficulty breathing or for any other reason.  Motrin and Tylenol for pain.

## 2021-09-08 ENCOUNTER — Emergency Department (HOSPITAL_COMMUNITY)
Admission: EM | Admit: 2021-09-08 | Discharge: 2021-09-08 | Disposition: A | Discharge status: Home / Self Care | Payer: BLUE CROSS/BLUE SHIELD | Attending: Emergency Medicine | Admitting: Emergency Medicine

## 2021-09-08 ENCOUNTER — Other Ambulatory Visit: Payer: Self-pay

## 2021-09-08 ENCOUNTER — Encounter (HOSPITAL_COMMUNITY): Payer: Self-pay | Admitting: Emergency Medicine

## 2021-09-08 DIAGNOSIS — A64 Unspecified sexually transmitted disease: Secondary | ICD-10-CM | POA: Insufficient documentation

## 2021-09-08 DIAGNOSIS — J45909 Unspecified asthma, uncomplicated: Secondary | ICD-10-CM | POA: Insufficient documentation

## 2021-09-08 DIAGNOSIS — F17210 Nicotine dependence, cigarettes, uncomplicated: Secondary | ICD-10-CM | POA: Insufficient documentation

## 2021-09-08 LAB — URINALYSIS, ROUTINE W REFLEX MICROSCOPIC
Bilirubin Urine: NEGATIVE
Glucose, UA: NEGATIVE mg/dL
Ketones, ur: NEGATIVE mg/dL
Nitrite: NEGATIVE
Protein, ur: 30 mg/dL — AB
RBC / HPF: >50 RBC/hpf — ABNORMAL HIGH (ref 0–5)
Specific Gravity, Urine: 1.021 (ref 1.005–1.030)
WBC, UA: >50 WBC/hpf — ABNORMAL HIGH (ref 0–5)
pH: 6 (ref 5.0–8.0)

## 2021-09-08 MED ORDER — CEFTRIAXONE SODIUM 500 MG IJ SOLR
500.0000 mg | Freq: Once | INTRAMUSCULAR | Status: AC
  Administered 2021-09-08: 21:00:00 500 mg via INTRAMUSCULAR
  Filled 2021-09-08: qty 500

## 2021-09-08 MED ORDER — DOXYCYCLINE HYCLATE 100 MG PO CAPS: 100.0000 mg | ORAL_CAPSULE | Freq: Two times a day (BID) | ORAL | 0 refills | Status: DC

## 2021-09-08 MED ORDER — LIDOCAINE HCL (PF) 1 % IJ SOLN
INTRAMUSCULAR | Status: AC
  Administered 2021-09-08: 21:00:00 5 mL
  Filled 2021-09-08: qty 5

## 2021-09-08 NOTE — Discharge Instructions (Signed)
Take doxycycline twice daily for 10 days. Follow-up with your primary care doctor as needed for the toenail fungus, information provided above.

## 2021-09-08 NOTE — ED Notes (Signed)
Discharge instructions discussed with pt. Pt verbalized understanding. Pt stable and ambulatory. No signature pad available. 
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
## 2021-09-08 NOTE — ED Triage Notes (Signed)
Pt presents to ED Pov. Pt c/o penile discharge x2-3d. Pt reports having unprotected intercourse.

## 2021-09-09 LAB — GC/CHLAMYDIA PROBE AMP (~~LOC~~) NOT AT ARMC
Chlamydia: NEGATIVE
Comment: NEGATIVE
Comment: NORMAL
Neisseria Gonorrhea: POSITIVE — AB

## 2023-05-02 ENCOUNTER — Encounter (HOSPITAL_BASED_OUTPATIENT_CLINIC_OR_DEPARTMENT_OTHER): Payer: Self-pay

## 2023-05-02 ENCOUNTER — Emergency Department (HOSPITAL_BASED_OUTPATIENT_CLINIC_OR_DEPARTMENT_OTHER)
Admission: EM | Admit: 2023-05-02 | Discharge: 2023-05-02 | Disposition: A | Discharge status: Home / Self Care | Payer: BLUE CROSS/BLUE SHIELD | Attending: Emergency Medicine | Admitting: Emergency Medicine

## 2023-05-02 DIAGNOSIS — J45909 Unspecified asthma, uncomplicated: Secondary | ICD-10-CM | POA: Diagnosis not present

## 2023-05-02 DIAGNOSIS — Z23 Encounter for immunization: Secondary | ICD-10-CM | POA: Diagnosis not present

## 2023-05-02 DIAGNOSIS — L0231 Cutaneous abscess of buttock: Secondary | ICD-10-CM | POA: Diagnosis not present

## 2023-05-02 MED ORDER — IBUPROFEN 600 MG PO TABS: 600.0000 mg | ORAL_TABLET | Freq: Four times a day (QID) | ORAL | 0 refills | Status: AC | PRN

## 2023-05-02 MED ORDER — TETANUS-DIPHTH-ACELL PERTUSSIS 5-2.5-18.5 LF-MCG/0.5 IM SUSY
0.5000 mL | PREFILLED_SYRINGE | Freq: Once | INTRAMUSCULAR | Status: AC
  Administered 2023-05-02: 0.5 mL via INTRAMUSCULAR
  Filled 2023-05-02: qty 0.5

## 2023-05-02 MED ORDER — LIDOCAINE-EPINEPHRINE (PF) 2 %-1:200000 IJ SOLN
10.0000 mL | Freq: Once | INTRAMUSCULAR | Status: AC
  Administered 2023-05-02: 10 mL via INTRADERMAL
  Filled 2023-05-02: qty 20

## 2023-05-02 MED ORDER — DOXYCYCLINE HYCLATE 100 MG PO CAPS: 100.0000 mg | ORAL_CAPSULE | Freq: Two times a day (BID) | ORAL | 0 refills | Status: DC

## 2023-05-02 NOTE — Discharge Instructions (Signed)
Please apply warm moist compress to affected area several times daily to aid with healing.  Take antibiotic as prescribed.  Return if you have any concern.  Avoid tobacco use as it increases the risk of developing skin infection as well as other health condition.

## 2023-05-02 NOTE — ED Notes (Signed)
This RT in with Fayrene Helper, PA-C to chaperone I&D of abscess.

## 2023-05-02 NOTE — ED Notes (Signed)
EDPA at [facility], I&D complete

## 2023-05-02 NOTE — ED Provider Notes (Signed)
[facility] EMERGENCY DEPARTMENT AT [facility] Provider Note   CSN: [id number] Arrival date & time: 05/02/23  1045     History  Chief Complaint  Patient presents with   Abscess    Darrell Lewis is a 37 y.o. male.  The history is provided by the patient and medical records. No language interpreter was used.  Abscess    37 year old male significant history of asthma who presents with concerns of skin abscess.  Patient report he has a reoccurring skin abscess to his left buttock that comes and goes for the past several months.  For the past week it has become increasingly more irritative.  Last night he also noticed another bump to his left medial thigh that felt similar to the other lesion and it is tender to palpation.  He denies any specific treatment tried no fever no trauma no trouble urinating no trouble with bowel or bladder problem.  He is unsure of his last tetanus status.    Home Medications Prior to Admission medications   Medication Sig Start Date End Date Taking? Authorizing Provider  clindamycin (CLEOCIN) 300 MG capsule Take 1 capsule (300 mg total) by mouth 3 (three) times daily. 03/17/18   Rise Mu, PA-C  dicyclomine (BENTYL) 20 MG tablet Take 1 tablet (20 mg total) by mouth 4 (four) times daily as needed for spasms. 10/05/15   Dione Booze, MD  doxycycline (VIBRAMYCIN) 100 MG capsule Take 1 capsule (100 mg total) by mouth 2 (two) times daily. 09/08/21   Theron Arista, PA-C      Allergies    Amoxicillin and Shellfish allergy    Review of Systems   Review of Systems  All other systems reviewed and are negative.   Physical Exam Updated Vital Signs BP (!) 150/88 (BP Location: Right Arm)   Pulse 68   Temp 98.3 F (36.8 C) (Oral)   Resp 16   SpO2 100%  Physical Exam Vitals and nursing note reviewed.  Constitutional:      General: He is not in acute distress.    Appearance: He is well-developed.  HENT:     Head: Atraumatic.  Eyes:      Conjunctiva/sclera: Conjunctivae normal.  Genitourinary:    Comments: Chaperone present during exam.  There is a subcutaneous nodule approximately 2 cm in diameter noted to the mid proximal medial thigh with tenderness to palpation.  Another similar nodule noted to left gluteus also tender to palpation but no surrounding erythema noted. Musculoskeletal:     Cervical back: Neck supple.  Skin:    Findings: No rash.  Neurological:     Mental Status: He is alert.     ED Results / Procedures / Treatments   Labs (all labs ordered are listed, but only abnormal results are displayed) Labs Reviewed - No data to display  EKG None  Radiology No results found.  Procedures .Marland KitchenIncision and Drainage  Date/Time: 05/02/2023 11:58 AM  Performed by: Fayrene Helper, PA-C Authorized by: Fayrene Helper, PA-C   Consent:    Consent obtained:  Verbal   Consent given by:  Patient   Risks discussed:  Bleeding, incomplete drainage, pain and damage to other organs   Alternatives discussed:  No treatment Universal protocol:    Procedure explained and questions answered to patient or proxy's satisfaction: yes     Relevant documents present and verified: yes     Test results available : yes     Imaging studies available: yes  Required blood products, implants, devices, and special equipment available: yes     Site/side marked: yes     Immediately prior to procedure, a time out was called: yes     Patient identity confirmed:  Verbally with patient Location:    Type:  Abscess   Size:  2   Location:  Lower extremity   Lower extremity location: L medial thigh. Pre-procedure details:    Skin preparation:  Betadine Anesthesia:    Anesthesia method:  Local infiltration   Local anesthetic:  Lidocaine 1% WITH epi Procedure type:    Complexity:  Simple Procedure details:    Incision types:  Single straight   Incision depth:  Subcutaneous   Wound management:  Probed and deloculated, irrigated with saline  and extensive cleaning   Drainage:  Purulent   Drainage amount:  Scant   Packing materials:  None Post-procedure details:    Procedure completion:  Tolerated well, no immediate complications .Marland KitchenIncision and Drainage  Date/Time: 05/02/2023 11:58 AM  Performed by: Fayrene Helper, PA-C Authorized by: Fayrene Helper, PA-C   Consent:    Consent obtained:  Verbal   Consent given by:  Patient   Risks discussed:  Bleeding, incomplete drainage, pain and damage to other organs   Alternatives discussed:  No treatment Universal protocol:    Procedure explained and questions answered to patient or proxy's satisfaction: yes     Relevant documents present and verified: yes     Test results available : yes     Imaging studies available: yes     Required blood products, implants, devices, and special equipment available: yes     Site/side marked: yes     Immediately prior to procedure, a time out was called: yes     Patient identity confirmed:  Verbally with patient Location:    Type:  Abscess   Size:  1   Location: L gluteus. Pre-procedure details:    Skin preparation:  Betadine Anesthesia:    Anesthesia method:  Local infiltration   Local anesthetic:  Lidocaine 1% WITH epi Procedure type:    Complexity:  Simple Procedure details:    Incision types:  Single straight   Incision depth:  Subcutaneous   Wound management:  Probed and deloculated, irrigated with saline and extensive cleaning   Drainage:  Purulent   Drainage amount:  Scant   Packing materials:  None Post-procedure details:    Procedure completion:  Tolerated well, no immediate complications     Medications Ordered in ED Medications - No data to display  ED Course/ Medical Decision Making/ A&P                             Medical Decision Making Risk Prescription drug management.   BP (!) 150/88 (BP Location: Right Arm)   Pulse 68   Temp 98.3 F (36.8 C) (Oral)   Resp 16   SpO2 100%   73:15 AM 37 year old male  significant history of asthma who presents with concerns of skin abscess.  Patient report he has a reoccurring skin abscess to his left buttock that comes and goes for the past several months.  For the past week it has become increasingly more irritative.  Last night he also noticed another bump to his left medial thigh that felt similar to the other lesion and it is tender to palpation.  He denies any specific treatment tried no fever no trauma no trouble urinating no trouble  with bowel or bladder problem.  He is unsure of his last tetanus status.  On exam, patient is overall well-appearing.  Chaperone was present on exam and patient does have 2 subcutaneous nodules noted to his left medial thigh and gluteal region amenable for incision and drainage.  Will update tetanus and will perform I&D.  11:59 AM On patient has 2 subcutaneous abscess that was incised and drained by me.  Patient tolerates well.  Recommend warm compress at home, will prescribe antibiotic as well.  I have considered labs including CBC, BMP, as well as CT scan of the left thigh and perineal region but felt that it is low yield and therefore further testing was not performed.  Patient updated on tetanus.  Social determinant of health including tobacco use.        Final Clinical Impression(s) / ED Diagnoses Final diagnoses:  Cutaneous abscess of buttock    Rx / DC Orders ED Discharge Orders          Ordered    doxycycline (VIBRAMYCIN) 100 MG capsule  2 times daily        05/02/23 1201    ibuprofen (ADVIL) 600 MG tablet  Every 6 hours PRN        05/02/23 1201              Fayrene Helper, PA-C 05/02/23 1202    Lorre Nick, MD 05/04/23 1342

## 2023-05-02 NOTE — ED Triage Notes (Signed)
He c/o two abscesses--one at right groin and another on his right inner proximal thigh.

## 2024-04-19 ENCOUNTER — Other Ambulatory Visit: Payer: Self-pay

## 2024-04-19 ENCOUNTER — Encounter (HOSPITAL_COMMUNITY): Payer: Self-pay

## 2024-04-19 ENCOUNTER — Encounter (HOSPITAL_COMMUNITY): Payer: Self-pay | Admitting: Emergency Medicine

## 2024-04-19 ENCOUNTER — Emergency Department (HOSPITAL_COMMUNITY)
Admission: EM | Admit: 2024-04-19 | Discharge: 2024-04-19 | Disposition: A | Discharge status: Home / Self Care | Payer: Self-pay | Attending: Emergency Medicine | Admitting: Emergency Medicine

## 2024-04-19 ENCOUNTER — Emergency Department (HOSPITAL_COMMUNITY)
Admission: EM | Admit: 2024-04-19 | Discharge: 2024-04-19 | Discharge status: Against Medical Advice | Payer: Self-pay | Attending: Emergency Medicine | Admitting: Emergency Medicine

## 2024-04-19 DIAGNOSIS — L0291 Cutaneous abscess, unspecified: Secondary | ICD-10-CM

## 2024-04-19 DIAGNOSIS — Z5321 Procedure and treatment not carried out due to patient leaving prior to being seen by health care provider: Secondary | ICD-10-CM | POA: Insufficient documentation

## 2024-04-19 DIAGNOSIS — L02214 Cutaneous abscess of groin: Secondary | ICD-10-CM | POA: Insufficient documentation

## 2024-04-19 MED ORDER — LIDOCAINE-EPINEPHRINE (PF) 2 %-1:200000 IJ SOLN
10.0000 mL | Freq: Once | INTRAMUSCULAR | Status: AC
  Administered 2024-04-19: 10 mL
  Filled 2024-04-19: qty 20

## 2024-04-19 MED ORDER — DOXYCYCLINE HYCLATE 100 MG PO CAPS: 100.0000 mg | ORAL_CAPSULE | Freq: Two times a day (BID) | ORAL | 0 refills | Status: AC

## 2024-04-19 NOTE — ED Triage Notes (Signed)
 Patient with abscess to R side groin area noticed for one day. Denies fevers chills.

## 2024-04-19 NOTE — ED Triage Notes (Signed)
 Pt complaining of abscesses on both sides of his groin. Noticed them 2-3 days ago and they are getting bigger.

## 2024-04-19 NOTE — ED Provider Notes (Signed)
 [place] EMERGENCY DEPARTMENT AT [facility] Provider Note   CSN: [id number] Arrival date & time: 04/19/24  0440     Patient presents with: Abscess   Christie Copley is a 38 y.o. male.   38 year old male presents today for concern of abscess in his right groin.  He states this has been going on since Monday.  Denies any fever.  Notices 1 on the opposite side as well but states that 1 is starting to resolve on its own.  Denies any drainage.  Has history of the same.  Has tried warm compress once.  No other complaints.  The history is provided by the patient. No language interpreter was used.       Prior to Admission medications   Medication Sig Start Date End Date Taking? Authorizing Provider  doxycycline  (VIBRAMYCIN ) 100 MG capsule Take 1 capsule (100 mg total) by mouth 2 (two) times daily. 04/19/24  Yes Dierdre Mccalip, PA-C  clindamycin  (CLEOCIN ) 300 MG capsule Take 1 capsule (300 mg total) by mouth 3 (three) times daily. 03/17/18   Annabell Vinie DASEN, PA-C  dicyclomine  (BENTYL ) 20 MG tablet Take 1 tablet (20 mg total) by mouth 4 (four) times daily as needed for spasms. 10/05/15   Raford Lenis, MD  ibuprofen  (ADVIL ) 600 MG tablet Take 1 tablet (600 mg total) by mouth every 6 (six) hours as needed. 05/02/23   Nivia Colon, PA-C    Allergies: Amoxicillin and Shellfish allergy    Review of Systems  Constitutional:  Negative for chills and fever.  Respiratory:  Negative for shortness of breath.   Skin:  Positive for wound (Abscess to right groin).  All other systems reviewed and are negative.   Updated Vital Signs BP 138/89 (BP Location: Right Arm)   Pulse (!) 58   Temp 98.8 F (37.1 C) (Oral)   Resp 18   Ht 5' 7 (1.702 m)   Wt 71.7 kg   SpO2 100%   BMI 24.75 kg/m   Physical Exam Vitals and nursing note reviewed.  Constitutional:      General: He is not in acute distress.    Appearance: Normal appearance. He is not ill-appearing.  HENT:     Head: Normocephalic  and atraumatic.     Nose: Nose normal.  Eyes:     Conjunctiva/sclera: Conjunctivae normal.  Cardiovascular:     Rate and Rhythm: Normal rate.  Pulmonary:     Effort: Pulmonary effort is normal. No respiratory distress.  Musculoskeletal:        General: No deformity. Normal range of motion.     Cervical back: Normal range of motion.  Skin:    Findings: No rash.     Comments: Abscess to right groin noted.  It is fluctuant.  Point-of-care ultrasound used.  Confined fluid pocket noted.  No surrounding erythema.  Neurological:     Mental Status: He is alert.     (all labs ordered are listed, but only abnormal results are displayed) Labs Reviewed - No data to display  EKG: None  Radiology: No results found.   .Incision and Drainage  Date/Time: 04/19/2024 8:24 AM  Performed by: Hildegard Loge, PA-C Authorized by: Hildegard Loge, PA-C   Consent:    Consent obtained:  Verbal   Consent given by:  Patient   Risks discussed:  Bleeding, incomplete drainage, pain and damage to other organs   Alternatives discussed:  No treatment Universal protocol:    Procedure explained and questions answered to patient or  proxy's satisfaction: yes     Relevant documents present and verified: yes     Test results available : yes     Imaging studies available: yes     Required blood products, implants, devices, and special equipment available: yes     Site/side marked: yes     Immediately prior to procedure, a time out was called: yes     Patient identity confirmed:  Verbally with patient Location:    Type:  Abscess   Size:  5cm   Location:  Lower extremity   Lower extremity location:  Leg   Leg location:  R upper leg Pre-procedure details:    Skin preparation:  Betadine Anesthesia:    Anesthesia method:  Local infiltration   Local anesthetic:  Lidocaine  2% WITH epi Procedure type:    Complexity:  Simple Procedure details:    Ultrasound guidance: yes     Incision types:  Single straight    Incision depth:  Subcutaneous   Wound management:  Probed and deloculated, irrigated with saline and extensive cleaning   Drainage:  Purulent and serosanguinous   Drainage amount:  Moderate   Wound treatment:  Wound left open   Packing materials:  None Post-procedure details:    Procedure completion:  Tolerated well, no immediate complications    Medications Ordered in the ED  lidocaine -EPINEPHrine  (XYLOCAINE  W/EPI) 2 %-1:200000 (PF) injection 10 mL (has no administration in time range)                                    Medical Decision Making Risk Prescription drug management.   38 year old male presents today for concern of abscess to the right groin just distal to the inguinal fold.  He states he had 1 on the left side but that is resolving.  Has tried warm compress once.  This started 2 to 3 days ago.  No fever.  It is fluctuant on exam.  Point-of-care ultrasound used.  There is a localized fluid pocket. Will I&D. See procedure note. Doxycycline  prescribed.  Discharged in stable condition.  Return precaution discussed.  Patient voices understanding and is in agreement with plan    Final diagnoses:  Abscess    ED Discharge Orders          Ordered    doxycycline  (VIBRAMYCIN ) 100 MG capsule  2 times daily        04/19/24 0803               Hildegard Loge, PA-C 04/19/24 0825    Dreama Longs, MD 04/20/24 [phone]

## 2024-04-19 NOTE — ED Notes (Signed)
 Called for patient no answer

## 2024-04-19 NOTE — Discharge Instructions (Addendum)
 Your abscess was drained.  Antibiotics prescribed.  Return for any concerning symptoms such as fever, worsening drainage.  Information to internal medicine clinic listed above for you to establish care.  Take Tylenol  and ibuprofen  as you need to for pain control.  You can take 1000 mg of Tylenol  every 6 hours, and 600 mg of ibuprofen  every 8 hours.

## 2024-07-06 ENCOUNTER — Other Ambulatory Visit: Payer: Self-pay

## 2024-07-06 ENCOUNTER — Emergency Department: Payer: Self-pay

## 2024-07-06 ENCOUNTER — Emergency Department
Admission: EM | Admit: 2024-07-06 | Discharge: 2024-07-06 | Payer: Self-pay | Attending: Emergency Medicine | Admitting: Emergency Medicine

## 2024-07-06 DIAGNOSIS — T07XXXA Unspecified multiple injuries, initial encounter: Secondary | ICD-10-CM

## 2024-07-06 DIAGNOSIS — S0081XA Abrasion of other part of head, initial encounter: Secondary | ICD-10-CM | POA: Insufficient documentation

## 2024-07-06 DIAGNOSIS — S80212A Abrasion, left knee, initial encounter: Secondary | ICD-10-CM | POA: Insufficient documentation

## 2024-07-06 DIAGNOSIS — S60511A Abrasion of right hand, initial encounter: Secondary | ICD-10-CM | POA: Insufficient documentation

## 2024-07-06 DIAGNOSIS — M25562 Pain in left knee: Secondary | ICD-10-CM

## 2024-07-06 DIAGNOSIS — M25511 Pain in right shoulder: Secondary | ICD-10-CM

## 2024-07-06 DIAGNOSIS — W500XXA Accidental hit or strike by another person, initial encounter: Secondary | ICD-10-CM | POA: Insufficient documentation

## 2024-07-06 DIAGNOSIS — Z23 Encounter for immunization: Secondary | ICD-10-CM | POA: Insufficient documentation

## 2024-07-06 DIAGNOSIS — J341 Cyst and mucocele of nose and nasal sinus: Secondary | ICD-10-CM | POA: Insufficient documentation

## 2024-07-06 DIAGNOSIS — S00211A Abrasion of right eyelid and periocular area, initial encounter: Secondary | ICD-10-CM | POA: Insufficient documentation

## 2024-07-06 DIAGNOSIS — S40211A Abrasion of right shoulder, initial encounter: Secondary | ICD-10-CM | POA: Insufficient documentation

## 2024-07-06 MED ORDER — TETANUS-DIPHTH-ACELL PERTUSSIS 5-2.5-18.5 LF-MCG/0.5 IM SUSY
0.5000 mL | PREFILLED_SYRINGE | Freq: Once | INTRAMUSCULAR | Status: AC
  Administered 2024-07-06: 0.5 mL via INTRAMUSCULAR
  Filled 2024-07-06: qty 0.5

## 2024-07-06 MED ORDER — ACETAMINOPHEN 325 MG PO TABS
650.0000 mg | ORAL_TABLET | Freq: Once | ORAL | Status: AC
  Administered 2024-07-06: 650 mg via ORAL
  Filled 2024-07-06: qty 2
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
## 2024-07-06 NOTE — Discharge Instructions (Addendum)
 Can take 60 mg of Tylenol  or 400 mg ibuprofen  every 6 hours as needed for pain.  I put in a primary care doctor referral for you, please be sure to follow-up.  Your CT did not show any traumatic injury but did incidentally show a mucous retention cyst in your sphenoid sinus.  Nothing that we need to do emergently but I have given you a number to call for ENT to follow-up.
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
